# Patient Record
Sex: Female | Born: 1960 | Race: Black or African American | Smoking: Former smoker
Health system: Southern US, Community
[De-identification: ages and names within clinical notes are randomized; demographics above are authoritative.]

## PROBLEM LIST (undated history)

## (undated) DIAGNOSIS — E079 Disorder of thyroid, unspecified: Secondary | ICD-10-CM

## (undated) HISTORY — DX: Disorder of thyroid, unspecified: E07.9

## (undated) HISTORY — PX: TUBAL LIGATION: SHX77

## (undated) HISTORY — PX: KNEE SURGERY: SHX244

## (undated) HISTORY — PX: HIP SURGERY: SHX245

---

## 2020-01-30 ENCOUNTER — Telehealth: Payer: Self-pay

## 2020-02-02 ENCOUNTER — Encounter: Payer: Self-pay | Admitting: Orthopaedic Surgery

## 2020-02-02 ENCOUNTER — Ambulatory Visit (INDEPENDENT_AMBULATORY_CARE_PROVIDER_SITE_OTHER): Payer: Managed Care, Other (non HMO)

## 2020-02-02 ENCOUNTER — Ambulatory Visit (INDEPENDENT_AMBULATORY_CARE_PROVIDER_SITE_OTHER): Payer: Managed Care, Other (non HMO) | Admitting: Orthopaedic Surgery

## 2020-02-02 ENCOUNTER — Other Ambulatory Visit: Payer: Self-pay

## 2020-02-02 VITALS — Ht 70.0 in | Wt 175.0 lb

## 2020-02-02 DIAGNOSIS — G8929 Other chronic pain: Secondary | ICD-10-CM

## 2020-02-02 DIAGNOSIS — S72409A Unspecified fracture of lower end of unspecified femur, initial encounter for closed fracture: Secondary | ICD-10-CM | POA: Insufficient documentation

## 2020-02-02 DIAGNOSIS — S329XXS Fracture of unspecified parts of lumbosacral spine and pelvis, sequela: Secondary | ICD-10-CM

## 2020-02-02 DIAGNOSIS — S72491S Other fracture of lower end of right femur, sequela: Secondary | ICD-10-CM | POA: Diagnosis not present

## 2020-02-02 DIAGNOSIS — M25552 Pain in left hip: Secondary | ICD-10-CM | POA: Diagnosis not present

## 2020-02-02 DIAGNOSIS — M25561 Pain in right knee: Secondary | ICD-10-CM | POA: Diagnosis not present

## 2020-02-02 DIAGNOSIS — S329XXA Fracture of unspecified parts of lumbosacral spine and pelvis, initial encounter for closed fracture: Secondary | ICD-10-CM | POA: Insufficient documentation
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
## 2020-02-14 ENCOUNTER — Encounter: Payer: Self-pay | Admitting: Family Medicine

## 2020-02-14 ENCOUNTER — Other Ambulatory Visit: Payer: Self-pay

## 2020-02-14 ENCOUNTER — Ambulatory Visit (INDEPENDENT_AMBULATORY_CARE_PROVIDER_SITE_OTHER): Payer: Managed Care, Other (non HMO) | Admitting: Family Medicine

## 2020-02-14 VITALS — BP 126/78 | HR 63 | Ht 70.0 in | Wt 186.8 lb

## 2020-02-14 DIAGNOSIS — Z0000 Encounter for general adult medical examination without abnormal findings: Secondary | ICD-10-CM

## 2020-02-14 DIAGNOSIS — I825Y9 Chronic embolism and thrombosis of unspecified deep veins of unspecified proximal lower extremity: Secondary | ICD-10-CM | POA: Diagnosis not present

## 2020-02-14 DIAGNOSIS — R5383 Other fatigue: Secondary | ICD-10-CM

## 2020-02-14 DIAGNOSIS — R030 Elevated blood-pressure reading, without diagnosis of hypertension: Secondary | ICD-10-CM | POA: Diagnosis not present

## 2020-02-14 NOTE — Addendum Note (Signed)
Addended by: Lillia Carmel on: 02/14/2020 01:53 PM   Modules accepted: Orders
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
## 2020-02-15 ENCOUNTER — Telehealth: Payer: Self-pay | Admitting: Family Medicine

## 2020-02-15 LAB — CBC WITH DIFFERENTIAL/PLATELET
Absolute Monocytes: 506 cells/uL (ref 200–950)
Basophils Absolute: 42 cells/uL (ref 0–200)
Basophils Relative: 0.5 %
Eosinophils Absolute: 249 cells/uL (ref 15–500)
Eosinophils Relative: 3 %
HCT: 45.5 % — ABNORMAL HIGH (ref 35.0–45.0)
Hemoglobin: 13.8 g/dL (ref 11.7–15.5)
Lymphs Abs: 3245 cells/uL (ref 850–3900)
MCH: 22.2 pg — ABNORMAL LOW (ref 27.0–33.0)
MCHC: 30.3 g/dL — ABNORMAL LOW (ref 32.0–36.0)
MCV: 73.3 fL — ABNORMAL LOW (ref 80.0–100.0)
MPV: 10.8 fL (ref 7.5–12.5)
Monocytes Relative: 6.1 %
Neutro Abs: 4258 cells/uL (ref 1500–7800)
Neutrophils Relative %: 51.3 %
Platelets: 323 10*3/uL (ref 140–400)
RBC: 6.21 10*6/uL — ABNORMAL HIGH (ref 3.80–5.10)
RDW: 15.8 % — ABNORMAL HIGH (ref 11.0–15.0)
Total Lymphocyte: 39.1 %
WBC: 8.3 10*3/uL (ref 3.8–10.8)

## 2020-02-15 LAB — COMPREHENSIVE METABOLIC PANEL
AG Ratio: 1.6 (calc) (ref 1.0–2.5)
ALT: 20 U/L (ref 6–29)
AST: 17 U/L (ref 10–35)
Albumin: 4.4 g/dL (ref 3.6–5.1)
Alkaline phosphatase (APISO): 123 U/L (ref 37–153)
BUN: 8 mg/dL (ref 7–25)
CO2: 26 mmol/L (ref 20–32)
Calcium: 9.9 mg/dL (ref 8.6–10.4)
Chloride: 104 mmol/L (ref 98–110)
Creat: 0.85 mg/dL (ref 0.50–0.99)
Globulin: 2.8 g/dL (calc) (ref 1.9–3.7)
Glucose, Bld: 99 mg/dL (ref 65–99)
Potassium: 3.8 mmol/L (ref 3.5–5.3)
Sodium: 141 mmol/L (ref 135–146)
Total Bilirubin: 0.4 mg/dL (ref 0.2–1.2)
Total Protein: 7.2 g/dL (ref 6.1–8.1)

## 2020-02-15 LAB — THYROID PANEL WITH TSH
Free Thyroxine Index: 2.8 (ref 1.4–3.8)
T3 Uptake: 29 % (ref 22–35)
T4, Total: 9.6 ug/dL (ref 5.1–11.9)
TSH: 0.44 mIU/L (ref 0.40–4.50)

## 2020-02-15 LAB — VITAMIN D 25 HYDROXY (VIT D DEFICIENCY, FRACTURES): Vit D, 25-Hydroxy: 35 ng/mL (ref 30–100)

## 2020-02-15 LAB — IRON,TIBC AND FERRITIN PANEL
%SAT: 29 % (calc) (ref 16–45)
Ferritin: 120 ng/mL (ref 16–232)
Iron: 117 ug/dL (ref 45–160)
TIBC: 407 mcg/dL (calc) (ref 250–450)

## 2020-02-15 NOTE — Telephone Encounter (Signed)
CBC is abnormal, but there's not iron-deficiency.  Other labs look good.  Would recommend rechecking CBC in 6-12 weeks to see if it normalizes.

## 2020-02-15 NOTE — Telephone Encounter (Signed)
I called and advised the patient of her results. She forgot to ask for prescriptions for her seborrheic dermatitis yesterday: ketoconazole shampoo 2%, clobetasol propionate topical solution (for her scalp after the shampoo) & a cream for her face and ears - she does not remember the name  - ? Ketoconazole cream.   Also, she c/o dry mouth at night - she has to sip water all night long. This started after being on gabapentin and other such meds. She said it is not as bad as before, but will this get better and is there anything else she can do/use for the mouth dryness?

## 2020-02-16 ENCOUNTER — Telehealth: Payer: Self-pay | Admitting: *Deleted

## 2020-02-16 MED ORDER — KETOCONAZOLE 2 % EX SHAM: 1.0000 "application " | MEDICATED_SHAMPOO | CUTANEOUS | 11 refills | Status: AC

## 2020-02-16 MED ORDER — CLOBETASOL PROPIONATE 0.05 % EX SHAM: 1.0000 "application " | MEDICATED_SHAMPOO | Freq: Two times a day (BID) | CUTANEOUS | 11 refills | Status: AC | PRN

## 2020-02-16 NOTE — Addendum Note (Signed)
Addended by: Lillia Carmel on: 02/16/2020 07:46 AM   Modules accepted: Orders

## 2020-02-16 NOTE — Telephone Encounter (Signed)
Pt is scheduled for ULTRASOUND Sept 17 at [facility], pt is aware of appt at 1:00

## 2020-02-16 NOTE — Telephone Encounter (Signed)
Two Rx's sent.  If she remembers the third, I'll call it in too.  I would hope the dry mouth would improve with time.  Not sure what else to recommend other than drinking water as she's doing.

## 2020-02-16 NOTE — Telephone Encounter (Signed)
I called and advised the patient. She said she is totally out of the cream for her nose, ears & face - unfortunately, this was originally prescribed in [place] and she cannot remember the name. The patient is asking if any face cream for seborrheic dermatitis could be sent in to the pharmacy for her.

## 2020-02-17 ENCOUNTER — Ambulatory Visit (HOSPITAL_COMMUNITY)
Admission: RE | Admit: 2020-02-17 | Discharge: 2020-02-17 | Disposition: A | Discharge status: Home / Self Care | Payer: Managed Care, Other (non HMO) | Source: Ambulatory Visit | Attending: Family Medicine | Admitting: Family Medicine

## 2020-02-17 ENCOUNTER — Telehealth: Payer: Self-pay | Admitting: Family Medicine

## 2020-02-17 ENCOUNTER — Other Ambulatory Visit: Payer: Self-pay

## 2020-02-17 DIAGNOSIS — I825Y9 Chronic embolism and thrombosis of unspecified deep veins of unspecified proximal lower extremity: Secondary | ICD-10-CM

## 2020-02-17 MED ORDER — KETOCONAZOLE 2 % EX CREA: 1.0000 "application " | TOPICAL_CREAM | Freq: Every day | CUTANEOUS | 11 refills | Status: AC

## 2020-02-17 NOTE — Telephone Encounter (Signed)
I have tried calling the patient twice today (morning and now afternoon) and there is no answer. Voice mail box not set up yet. The patient can check with her pharmacy about the new Rx for the face cream.

## 2020-02-17 NOTE — Telephone Encounter (Signed)
Patient is aware of the results.

## 2020-02-17 NOTE — Addendum Note (Signed)
Addended by: Lillia Carmel on: 02/17/2020 07:54 AM   Modules accepted: Orders

## 2020-02-17 NOTE — Telephone Encounter (Signed)
I called the patient. Dr. [facility] does not accept [employer]. Do you know if there are any optomotrists/ophthalmologists in the [facility] system? The patient is wanting to keep everything with [facility], if at all possible.

## 2020-02-17 NOTE — Telephone Encounter (Signed)
Rx sent 

## 2020-02-17 NOTE — Telephone Encounter (Signed)
Doppler was negative for blood clot.

## 2020-02-17 NOTE — Telephone Encounter (Signed)
Pt would like a CB in regards to a referral that fell through because the facility didn't take her [employer]   [phone]

## 2020-02-17 NOTE — Progress Notes (Signed)
Lower extremity venous bilateral study completed.  Left message for Hilts, MD regarding routine preliminary results.    See CV Proc for preliminary results report.   Jean Rosenthal

## 2020-02-22 NOTE — Telephone Encounter (Signed)
We can try [facility], their website says they take [place].

## 2020-02-22 NOTE — Telephone Encounter (Signed)
noted 

## 2020-02-24 ENCOUNTER — Telehealth: Payer: Self-pay

## 2020-02-24 NOTE — Telephone Encounter (Signed)
The patient was called by [facility] today - the first available with any of the doctors there is 04/23/2020. She is just wanting an eye exam and Rx for glasses. I will do some checking around and call her next week.

## 2020-02-24 NOTE — Telephone Encounter (Signed)
Patient called regarding last message . Call back:[phone]

## 2020-02-27 NOTE — Telephone Encounter (Signed)
I called the patient after doing some research on area optometrists online - difficulty finding one that takes [place]. I advised the patient to either call [employer] directly or check their website for assistance with finding a doctor. She can schedule that appointment herself. If she does need something done about her cataracts and needs to see an ophthalmologist, she might need a referral for that. The patient does still have the current referral for [facility], though.   Also, the patient mentioned she needed to give me a different address for the Cologuard paperwork. She said she would call back with this info and give it to whomever answers the phone here. Once received, I will refax the paperwork to [employer].

## 2020-03-02 ENCOUNTER — Other Ambulatory Visit: Payer: Self-pay

## 2020-03-02 ENCOUNTER — Encounter: Payer: Self-pay | Admitting: Obstetrics and Gynecology

## 2020-03-02 ENCOUNTER — Ambulatory Visit (INDEPENDENT_AMBULATORY_CARE_PROVIDER_SITE_OTHER): Payer: Managed Care, Other (non HMO) | Admitting: Obstetrics and Gynecology

## 2020-03-02 VITALS — BP 122/78 | Ht 70.0 in | Wt 190.0 lb

## 2020-03-02 DIAGNOSIS — Z01419 Encounter for gynecological examination (general) (routine) without abnormal findings: Secondary | ICD-10-CM | POA: Diagnosis not present

## 2020-03-02 DIAGNOSIS — R300 Dysuria: Secondary | ICD-10-CM | POA: Diagnosis not present

## 2020-03-02 DIAGNOSIS — Z124 Encounter for screening for malignant neoplasm of cervix: Secondary | ICD-10-CM | POA: Diagnosis not present

## 2020-03-02 DIAGNOSIS — R350 Frequency of micturition: Secondary | ICD-10-CM | POA: Diagnosis not present

## 2020-03-02 NOTE — Addendum Note (Signed)
Addended by: Dayna Barker on: 03/02/2020 01:13 PM   Modules accepted: Orders
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
## 2020-03-05 ENCOUNTER — Telehealth: Payer: Self-pay | Admitting: Family Medicine

## 2020-03-05 MED ORDER — CLOBETASOL PROPIONATE 0.05 % EX SOLN: 1.0000 "application " | Freq: Two times a day (BID) | CUTANEOUS | 6 refills | Status: AC

## 2020-03-05 NOTE — Telephone Encounter (Signed)
Pt called stating she was referred to an eye place and she would like the name and number for the facility   [phone]

## 2020-03-05 NOTE — Telephone Encounter (Signed)
Please advise 

## 2020-03-05 NOTE — Telephone Encounter (Signed)
I called and advised the patient this was sent in to her pharmacy. 

## 2020-03-05 NOTE — Telephone Encounter (Signed)
Sent!

## 2020-03-05 NOTE — Telephone Encounter (Signed)
Pt states she was given two rxs for shampoo, instead a shampoo and oil rx. Pt would like the colbepasol-propionate tropical solution USD 0.05% called in please and a CB to let her know we sent in  [phone]

## 2020-03-05 NOTE — Telephone Encounter (Signed)
I called the patient. She checked with Rosann Auerbach about an eye doctor - she said she was told that she was only responsible for her copay for in-network and out-of-network eye doctors, and the insurance covered the rest regardless. I gave her the phone number to [facility] and [facility] - she will call these, herself, to schedule an appt.

## 2020-03-06 ENCOUNTER — Telehealth: Payer: Self-pay

## 2020-03-06 LAB — PAP IG W/ RFLX HPV ASCU

## 2020-03-06 NOTE — Telephone Encounter (Signed)
Patient asked about her urine result. I did not have a result. I checked with Joni Reining. Order is in but was not run. Patient advised and advised that she can make lab appt and come back by at her convenience to get a urine specimen for lab to run.

## 2020-03-06 NOTE — Telephone Encounter (Signed)
-----   Message from Theresia Majors, MD sent at 03/06/2020  2:18 PM EDT ----- Please tell her the Pap smear is normal.

## 2020-03-14 LAB — COLOGUARD: Cologuard: POSITIVE — AB

## 2020-03-15 ENCOUNTER — Other Ambulatory Visit: Payer: Self-pay

## 2020-03-15 ENCOUNTER — Ambulatory Visit (INDEPENDENT_AMBULATORY_CARE_PROVIDER_SITE_OTHER): Payer: Managed Care, Other (non HMO) | Admitting: Orthopaedic Surgery

## 2020-03-15 ENCOUNTER — Encounter: Payer: Self-pay | Admitting: Orthopaedic Surgery

## 2020-03-15 VITALS — Ht 70.0 in | Wt 190.0 lb

## 2020-03-15 DIAGNOSIS — S329XXS Fracture of unspecified parts of lumbosacral spine and pelvis, sequela: Secondary | ICD-10-CM

## 2020-03-15 DIAGNOSIS — S72491S Other fracture of lower end of right femur, sequela: Secondary | ICD-10-CM
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
## 2020-03-22 ENCOUNTER — Telehealth: Payer: Self-pay | Admitting: Family Medicine

## 2020-03-22 DIAGNOSIS — R195 Other fecal abnormalities: Secondary | ICD-10-CM

## 2020-03-22 NOTE — Telephone Encounter (Signed)
I called and advised the patient of the results and plan. She is currently out-of-town for a little while, but will await a call from GI to schedule the procedure.

## 2020-03-22 NOTE — Telephone Encounter (Signed)
Cologuard test was positive.  This does not mean she has colon cancer, but it does mean that she should have a colonoscopy.  I will place orders for referral.

## 2020-04-18 ENCOUNTER — Telehealth: Payer: Self-pay | Admitting: Orthopaedic Surgery

## 2020-04-18 NOTE — Telephone Encounter (Signed)
Received call from patient. Wants her records to be sent to Dr. Chalmers Cater in [place]. I advised she needs to complete authorization. I emailed form to her [email or web address]

## 2020-04-23 ENCOUNTER — Other Ambulatory Visit: Payer: Self-pay

## 2020-04-23 ENCOUNTER — Telehealth: Payer: Self-pay | Admitting: Orthopaedic Surgery

## 2020-04-23 DIAGNOSIS — S329XXS Fracture of unspecified parts of lumbosacral spine and pelvis, sequela: Secondary | ICD-10-CM

## 2020-04-23 NOTE — Progress Notes (Signed)
CT

## 2020-04-23 NOTE — Telephone Encounter (Signed)
CT scan left hemi pelvis-hip pain with possible fracture versus hip osteoarthritis

## 2020-04-23 NOTE — Telephone Encounter (Signed)
Patient called needing to be set up for a CT scan. Patient said Dr. Cleophas Dunker wanted her to have one. Patient said she will only be in town for 2 weeks.   Also, patient said Dr Prince Rome wanted her to have a colonoscopy. Patient said she is in town for only 2 weeks. The number to contact patient is [phone]

## 2020-04-23 NOTE — Telephone Encounter (Signed)
I called the patient and gave her the number and address to Dr. Marvell Fuller office (the referral had already been placed). Advised her to call them to see if they can accommodate her time frame.

## 2020-04-23 NOTE — Telephone Encounter (Signed)
Both have been ordered 

## 2020-04-23 NOTE — Telephone Encounter (Signed)
Please read last office note from October and advise.

## 2020-05-07 ENCOUNTER — Ambulatory Visit
Admission: RE | Admit: 2020-05-07 | Discharge: 2020-05-07 | Disposition: A | Discharge status: Home / Self Care | Payer: Managed Care, Other (non HMO) | Source: Ambulatory Visit | Attending: Orthopaedic Surgery | Admitting: Orthopaedic Surgery

## 2020-05-07 ENCOUNTER — Other Ambulatory Visit: Payer: Self-pay

## 2020-05-07 DIAGNOSIS — S329XXS Fracture of unspecified parts of lumbosacral spine and pelvis, sequela: Secondary | ICD-10-CM

## 2020-05-21 ENCOUNTER — Telehealth: Payer: Self-pay | Admitting: Orthopaedic Surgery

## 2020-05-21 NOTE — Telephone Encounter (Signed)
Spoke with patient,advised just faxed records earlier today. Advised her that I've been out of the office for couple weeks. She stated she was there at doctor's office now and they told her they did not have records. She confirmed fax number and it's same number. I refaxed records [phone]

## 2020-07-03 ENCOUNTER — Telehealth: Payer: Self-pay | Admitting: Orthopaedic Surgery

## 2020-07-03 NOTE — Telephone Encounter (Signed)
Received vm from [facility] w/ Dr. Richardson Chiquito office. Stating has not received records still. Records faxed twice on 05/21/20. Confirmation went through. Tried calling several times,each time, get fast busy. I have refaxed records. The fax number was verified with the patient on 12/20 when faxed 2nd time.Steward Drone ph [phone]/[facility] [phone]
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
## 2022-07-31 NOTE — Telephone Encounter (Signed)
Error
